# Patient Record
Sex: Female | Born: 1955 | Race: White | Hispanic: No | Marital: Married | Smoking: Never smoker
Health system: Southern US, Community
[De-identification: ages and names within clinical notes are randomized; demographics above are authoritative.]

## PROBLEM LIST (undated history)

## (undated) DIAGNOSIS — M1990 Unspecified osteoarthritis, unspecified site: Secondary | ICD-10-CM

## (undated) DIAGNOSIS — J40 Bronchitis, not specified as acute or chronic: Secondary | ICD-10-CM

## (undated) HISTORY — PX: PARTIAL HIP ARTHROPLASTY: SHX733

## (undated) HISTORY — PX: APPENDECTOMY: SHX54

## (undated) HISTORY — PX: TUBAL LIGATION: SHX77

## (undated) HISTORY — PX: MENISCUS REPAIR: SHX5179

---

## 2019-12-28 ENCOUNTER — Other Ambulatory Visit: Payer: Self-pay | Admitting: Orthopedic Surgery

## 2020-01-17 NOTE — Patient Instructions (Signed)
DUE TO COVID-19 ONLY TWO VISITORS ARE ALLOWED TO COME WITH YOU AND STAY IN THE WAITING ROOM ONLY DURING PRE OP AND PROCEDURE DAY OF SURGERY. THE 2 VISITORS MAY VISIT WITH YOU AFTER SURGERY IN YOUR PRIVATE ROOM DURING VISITING HOURS ONLY!  YOU NEED TO HAVE A COVID 19 TEST ON: 01/24/20 @        , THIS TEST MUST BE DONE BEFORE SURGERY, COME  Danielle, [address] , [id number].  ([place]) ONCE YOUR COVID TEST IS COMPLETED, PLEASE BEGIN THE QUARANTINE INSTRUCTIONS AS OUTLINED IN YOUR HANDOUT.                Danielle Andersen    Your procedure is scheduled on: 01/28/20   Report to [facility] Main  Entrance   Report to SHORT STAY at: 5:30 AM     Call this number if you have problems the morning of surgery [phone]    Remember:   [place], NO CHEWING GUM [place].     Take these medicines the morning of surgery with A SIP OF WATER: CETIRIZINE,GABAPENTIN.USE INHALERS AS USUAL.                                 You may not have any metal on your body including hair pins and              piercings  Do not wear jewelry, make-up, lotions, powders or perfumes, deodorant             Do not wear nail polish on your fingernails.  Do not shave  48 hours prior to surgery.                Do not bring valuables to the hospital. [place].  Contacts, dentures or bridgework may not be worn into surgery.  Leave suitcase in the car. After surgery it may be brought to your room.     Patients discharged the day of surgery will not be allowed to drive home. IF YOU ARE HAVING SURGERY AND GOING HOME THE SAME DAY, YOU MUST HAVE AN ADULT TO DRIVE YOU HOME AND BE WITH YOU FOR 24 HOURS. YOU MAY GO HOME BY TAXI OR UBER OR ORTHERWISE, BUT AN ADULT MUST ACCOMPANY YOU HOME AND STAY WITH YOU FOR 24 HOURS.  Name and phone number of your driver:  Special Instructions: N/A      Please read over the following fact sheets you were given: _____________________________________________________________________             NO SOLID FOOD AFTER MIDNIGHT THE NIGHT PRIOR TO SURGERY. NOTHING BY MOUTH EXCEPT CLEAR LIQUIDS UNTIL: 4:15 AM . PLEASE FINISH ENSURE DRINK PER SURGEON ORDER  WHICH NEEDS TO BE COMPLETED AT: 4:15 AM .   CLEAR LIQUID DIET   Foods Allowed                                                                     Foods Excluded  Coffee  and tea, regular and decaf                             liquids that you cannot  Plain Jell-O any favor except red or purple                                           see through such as: Fruit ices (not with fruit pulp)                                     milk, soups, orange juice  Iced Popsicles                                    All solid food Carbonated beverages, regular and diet                                    Cranberry, grape and apple juices Sports drinks like Gatorade Lightly seasoned clear broth or consume(fat free) Sugar, honey syrup  Sample Menu Breakfast                                Lunch                                     Supper Cranberry juice                    Beef broth                            Chicken broth Jell-O                                     Grape juice                           Apple juice Coffee or tea                        Jell-O                                      Popsicle                                                Coffee or tea                        Coffee or tea  _____________________________________________________________________  [facility] - Preparing for Surgery Before surgery, you can play an important role.  Because skin is not sterile, your skin needs to be as  free of germs as possible.  You can reduce the number of germs on your skin by washing with CHG (chlorahexidine gluconate) soap before surgery.  CHG is an antiseptic cleaner which kills germs and bonds with  the skin to continue killing germs even after washing. Please DO NOT use if you have an allergy to CHG or antibacterial soaps.  If your skin becomes reddened/irritated stop using the CHG and inform your nurse when you arrive at Short Stay. Do not shave (including legs and underarms) for at least 48 hours prior to the first CHG shower.  You may shave your face/neck. Please follow these instructions carefully:  1.  Shower with CHG Soap the night before surgery and the  morning of Surgery.  2.  If you choose to wash your hair, wash your hair first as usual with your  normal  shampoo.  3.  After you shampoo, rinse your hair and body thoroughly to remove the  shampoo.                           4.  Use CHG as you would any other liquid soap.  You can apply chg directly  to the skin and wash                       Gently with a scrungie or clean washcloth.  5.  Apply the CHG Soap to your body ONLY FROM THE NECK DOWN.   Do not use on face/ open                           Wound or open sores. Avoid contact with eyes, ears mouth and genitals (private parts).                       Wash face,  Genitals (private parts) with your normal soap.             6.  Wash thoroughly, paying special attention to the area where your surgery  will be performed.  7.  Thoroughly rinse your body with warm water from the neck down.  8.  DO NOT shower/wash with your normal soap after using and rinsing off  the CHG Soap.                9.  Pat yourself dry with a clean towel.            10.  Wear clean pajamas.            11.  Place clean sheets on your bed the night of your first shower and do not  sleep with pets. Day of Surgery : Do not apply any lotions/deodorants the morning of surgery.  Please wear clean clothes to the hospital/surgery center.  FAILURE TO FOLLOW THESE INSTRUCTIONS MAY RESULT IN THE CANCELLATION OF YOUR SURGERY PATIENT SIGNATURE_________________________________  NURSE  SIGNATURE__________________________________  ________________________________________________________________________   Danielle Andersen  An incentive spirometer is a tool that can help keep your lungs clear and active. This tool measures how well you are filling your lungs with each breath. Taking long deep breaths may help reverse or decrease the chance of developing breathing (pulmonary) problems (especially infection) following:  A long period of time when you are unable to move or be active. BEFORE THE PROCEDURE   If the spirometer includes an indicator to show your best effort,  your nurse or respiratory therapist will set it to a desired goal.  If possible, sit up straight or lean slightly forward. Try not to slouch.  Hold the incentive spirometer in an upright position. INSTRUCTIONS FOR USE  1. Sit on the edge of your bed if possible, or sit up as far as you can in bed or on a chair. 2. Hold the incentive spirometer in an upright position. 3. Breathe out normally. 4. Place the mouthpiece in your mouth and seal your lips tightly around it. 5. Breathe in slowly and as deeply as possible, raising the piston or the ball toward the top of the column. 6. Hold your breath for 3-5 seconds or for as long as possible. Allow the piston or ball to fall to the bottom of the column. 7. Remove the mouthpiece from your mouth and breathe out normally. 8. Rest for a few seconds and repeat Steps 1 through 7 at least 10 times every 1-2 hours when you are awake. Take your time and take a few normal breaths between deep breaths. 9. The spirometer may include an indicator to show your best effort. Use the indicator as a goal to work toward during each repetition. 10. After each set of 10 deep breaths, practice coughing to be sure your lungs are clear. If you have an incision (the cut made at the time of surgery), support your incision when coughing by placing a pillow or rolled up towels firmly  against it. Once you are able to get out of bed, walk around indoors and cough well. You may stop using the incentive spirometer when instructed by your caregiver.  RISKS AND COMPLICATIONS  Take your time so you do not get dizzy or light-headed.  If you are in pain, you may need to take or ask for pain medication before doing incentive spirometry. It is harder to take a deep breath if you are having pain. AFTER USE  Rest and breathe slowly and easily.  It can be helpful to keep track of a log of your progress. Your caregiver can provide you with a simple table to help with this. If you are using the spirometer at home, follow these instructions: Danielle Andersen IF:   You are having difficultly using the spirometer.  You have trouble using the spirometer as often as instructed.  Your pain medication is not giving enough relief while using the spirometer.  You develop fever of 100.5 F (38.1 C) or higher. SEEK IMMEDIATE MEDICAL CARE IF:   You cough up bloody sputum that had not been present before.  You develop fever of 102 F (38.9 C) or greater.  You develop worsening pain at or near the incision site. MAKE SURE YOU:   Understand these instructions.  Will watch your condition.  Will get help right away if you are not doing well or get worse. Document Released: 02/21/2007 Document Revised: 01/03/2012 Document Reviewed: 04/24/2007 [facility] Patient Information 2014 [place], [place].   ________________________________________________________________________

## 2020-01-18 ENCOUNTER — Ambulatory Visit (HOSPITAL_COMMUNITY)
Admission: RE | Admit: 2020-01-18 | Discharge: 2020-01-18 | Disposition: A | Discharge status: Home / Self Care | Payer: No Typology Code available for payment source | Source: Ambulatory Visit | Attending: Orthopedic Surgery | Admitting: Orthopedic Surgery

## 2020-01-18 ENCOUNTER — Encounter (HOSPITAL_COMMUNITY)
Admission: RE | Admit: 2020-01-18 | Discharge: 2020-01-18 | Disposition: A | Discharge status: Home / Self Care | Payer: No Typology Code available for payment source | Source: Ambulatory Visit | Attending: Orthopedic Surgery | Admitting: Orthopedic Surgery

## 2020-01-18 ENCOUNTER — Other Ambulatory Visit: Payer: Self-pay

## 2020-01-18 ENCOUNTER — Encounter (HOSPITAL_COMMUNITY): Payer: Self-pay

## 2020-01-18 DIAGNOSIS — Z01818 Encounter for other preprocedural examination: Secondary | ICD-10-CM | POA: Diagnosis not present

## 2020-01-18 DIAGNOSIS — M1711 Unilateral primary osteoarthritis, right knee: Secondary | ICD-10-CM | POA: Diagnosis present

## 2020-01-18 HISTORY — DX: Unspecified osteoarthritis, unspecified site: M19.90

## 2020-01-18 HISTORY — DX: Bronchitis, not specified as acute or chronic: J40

## 2020-01-18 LAB — CBC WITH DIFFERENTIAL/PLATELET
Abs Immature Granulocytes: 0.02 10*3/uL (ref 0.00–0.07)
Basophils Absolute: 0.1 10*3/uL (ref 0.0–0.1)
Basophils Relative: 1 %
Eosinophils Absolute: 0.1 10*3/uL (ref 0.0–0.5)
Eosinophils Relative: 2 %
HCT: 37.9 % (ref 36.0–46.0)
Hemoglobin: 11.9 g/dL — ABNORMAL LOW (ref 12.0–15.0)
Immature Granulocytes: 0 %
Lymphocytes Relative: 31 %
Lymphs Abs: 2.1 10*3/uL (ref 0.7–4.0)
MCH: 31.1 pg (ref 26.0–34.0)
MCHC: 31.4 g/dL (ref 30.0–36.0)
MCV: 99 fL (ref 80.0–100.0)
Monocytes Absolute: 0.5 10*3/uL (ref 0.1–1.0)
Monocytes Relative: 7 %
Neutro Abs: 4 10*3/uL (ref 1.7–7.7)
Neutrophils Relative %: 59 %
Platelets: 326 10*3/uL (ref 150–400)
RBC: 3.83 MIL/uL — ABNORMAL LOW (ref 3.87–5.11)
RDW: 13.2 % (ref 11.5–15.5)
WBC: 6.7 10*3/uL (ref 4.0–10.5)
nRBC: 0 % (ref 0.0–0.2)

## 2020-01-18 LAB — URINALYSIS, ROUTINE W REFLEX MICROSCOPIC
Bilirubin Urine: NEGATIVE
Glucose, UA: NEGATIVE mg/dL
Hgb urine dipstick: NEGATIVE
Ketones, ur: NEGATIVE mg/dL
Leukocytes,Ua: NEGATIVE
Nitrite: NEGATIVE
Protein, ur: NEGATIVE mg/dL
Specific Gravity, Urine: 1.004 — ABNORMAL LOW (ref 1.005–1.030)
pH: 7 (ref 5.0–8.0)

## 2020-01-18 LAB — BASIC METABOLIC PANEL
Anion gap: 9 (ref 5–15)
BUN: 15 mg/dL (ref 8–23)
CO2: 26 mmol/L (ref 22–32)
Calcium: 9.4 mg/dL (ref 8.9–10.3)
Chloride: 105 mmol/L (ref 98–111)
Creatinine, Ser: 0.66 mg/dL (ref 0.44–1.00)
GFR calc Af Amer: >60 mL/min (ref >60)
GFR calc non Af Amer: >60 mL/min (ref >60)
Glucose, Bld: 82 mg/dL (ref 70–99)
Potassium: 4.2 mmol/L (ref 3.5–5.1)
Sodium: 140 mmol/L (ref 135–145)

## 2020-01-18 LAB — ABO/RH: ABO/RH(D): B POS

## 2020-01-18 LAB — PROTIME-INR
INR: 0.9 (ref 0.8–1.2)
Prothrombin Time: 12.3 seconds (ref 11.4–15.2)

## 2020-01-18 LAB — APTT: aPTT: 30 seconds (ref 24–36)

## 2020-01-18 NOTE — H&P (Signed)
TOTAL KNEE ADMISSION H&P  Patient is being admitted for right total knee arthroplasty.  Subjective:  Chief Complaint:right knee pain.  HPI: Danielle Andersen, 64 y.o. female, has a history of pain and functional disability in the right knee due to trauma and arthritis and has failed non-surgical conservative treatments for greater than 12 weeks to includeNSAID's and/or analgesics, corticosteriod injections, supervised PT with diminished ADL's post treatment and activity modification.  Onset of symptoms was abrupt, starting 3 years ago with rapidlly worsening course since that time. The patient noted prior procedures on the knee to include  arthroscopy on the right knee(s).  Patient currently rates pain in the right knee(s) at 10 out of 10 with activity. Patient has night pain, worsening of pain with activity and weight bearing, pain that interferes with activities of daily living, pain with passive range of motion, crepitus and joint swelling.  Patient has evidence of subchondral sclerosis and joint space narrowing by imaging studies.  There is no active infection.  There are no problems to display for this patient.  Past Medical History:  Diagnosis Date  . Arthritis   . Bronchitis     Past Surgical History:  Procedure Laterality Date  . APPENDECTOMY    . MENISCUS REPAIR Right   . PARTIAL HIP ARTHROPLASTY    . TUBAL LIGATION      No current facility-administered medications for this encounter.   Current Outpatient Medications  Medication Sig Dispense Refill Last Dose  . albuterol (VENTOLIN HFA) 108 (90 Base) MCG/ACT inhaler Inhale 1-2 puffs into the lungs every 6 (six) hours as needed for wheezing or shortness of breath.     . cetirizine (ZYRTEC) 10 MG tablet Take 10 mg by mouth daily.     . diphenhydramine-acetaminophen (TYLENOL PM EXTRA STRENGTH) 25-500 MG TABS tablet Take 1 tablet by mouth at bedtime.     Marland Kitchen DIPHENHYDRAMINE-ACETAMINOPHEN PO Take 1 tablet by mouth daily. Legatrin PM  Pain Reliever/Sleep Aid     . docusate sodium (STOOL SOFTENER) 100 MG capsule Take 100-200 mg by mouth every other day as needed (constipation.).      Marland Kitchen gabapentin (NEURONTIN) 100 MG capsule Take 100 mg by mouth 3 (three) times daily.     . meloxicam (MOBIC) 15 MG tablet Take 15 mg by mouth daily after supper.     . tetrahydrozoline 0.05 % ophthalmic solution Place 2 drops into both eyes daily.     . traMADol (ULTRAM) 50 MG tablet Take 100 mg by mouth 3 (three) times daily as needed.      No Known Allergies  Social History   Tobacco Use  . Smoking status: Never Smoker  . Smokeless tobacco: Never Used  Substance Use Topics  . Alcohol use: Never    No family history on file.   Review of Systems  Constitutional: Positive for fatigue.  HENT: Positive for tinnitus.   Respiratory: Negative.   Cardiovascular: Positive for palpitations.       HTN  Gastrointestinal: Positive for constipation and diarrhea.  Endocrine: Negative.   Genitourinary: Negative.   Musculoskeletal: Positive for arthralgias and myalgias.  Allergic/Immunologic: Negative.   Neurological: Positive for dizziness.  Hematological: Negative.   Psychiatric/Behavioral: Negative.     Objective:  Physical Exam  Constitutional: She is oriented to person, place, and time. She appears well-developed and well-nourished.  HENT:  Head: Normocephalic and atraumatic.  Eyes: Pupils are equal, round, and reactive to light.  Cardiovascular: Intact distal pulses.  Respiratory: Effort normal.  Musculoskeletal:  General: Tenderness present.     Cervical back: Normal range of motion and neck supple.     Comments: Tender along the medial joint line of the right knee range of motion today is 5/130 she walks with a right-sided limp there is obvious varus deformity 1+ effusion.    Neurological: She is oriented to person, place, and time.  Skin: Skin is warm and dry.  Psychiatric: She has a normal mood and affect. Her behavior  is normal. Judgment and thought content normal.    Vital signs in last 24 hours: Temp:  [98.2 F (36.8 C)] 98.2 F (36.8 C) (03/26 0943) Pulse Rate:  [82] 82 (03/26 0943) Resp:  [16] 16 (03/26 0943) BP: (138)/(71) 138/71 (03/26 0943) SpO2:  [99 %] 99 % (03/26 0943) Weight:  [62.7 kg] 62.7 kg (03/26 0943)  Labs:   Estimated body mass index is 25.28 kg/m as calculated from the following:   Height as of 01/18/20: 5\' 2"  (1.575 m).   Weight as of 01/18/20: 62.7 kg.   Imaging Review Plain radiographs confirm the bone-on-bone arthritic changes sclerotic medial bone and early erosion of the medial tibial plateau.  Assessment/Plan:  End stage arthritis, right knee   The patient history, physical examination, clinical judgment of the provider and imaging studies are consistent with end stage degenerative joint disease of the right knee(s) and total knee arthroplasty is deemed medically necessary. The treatment options including medical management, injection therapy arthroscopy and arthroplasty were discussed at length. The risks and benefits of total knee arthroplasty were presented and reviewed. The risks due to aseptic loosening, infection, stiffness, patella tracking problems, thromboembolic complications and other imponderables were discussed. The patient acknowledged the explanation, agreed to proceed with the plan and consent was signed. Patient is being admitted for inpatient treatment for surgery, pain control, PT, OT, prophylactic antibiotics, VTE prophylaxis, progressive ambulation and ADL's and discharge planning. The patient is planning to be discharged home with home health services     Patient's anticipated LOS is less than 2 midnights, meeting these requirements: - Younger than 17 - Lives within 1 hour of care - Has a competent adult at home to recover with post-op recover - NO history of  - Chronic pain requiring opiods  - Diabetes  - Coronary Artery Disease  - Heart  failure  - Heart attack  - Stroke  - DVT/VTE  - Cardiac arrhythmia  - Respiratory Failure/COPD  - Renal failure  - Anemia  - Advanced Liver disease

## 2020-01-18 NOTE — Progress Notes (Signed)
PCP - Jomarie Longs Copper. PAC. LOV: 03/30/19. Cardiologist -   Chest x-ray -  EKG -  Stress Test -  ECHO -  Cardiac Cath -   Sleep Study -  CPAP -   Fasting Blood Sugar -  Checks Blood Sugar _____ times a day  Blood Thinner Instructions: Aspirin Instructions: Last Dose:  Anesthesia review:   Patient denies shortness of breath, fever, cough and chest pain at PAT appointment   Patient verbalized understanding of instructions that were given to them at the PAT appointment. Patient was also instructed that they will need to review over the PAT instructions again at home before surgery.

## 2020-01-24 ENCOUNTER — Other Ambulatory Visit (HOSPITAL_COMMUNITY)
Admission: RE | Admit: 2020-01-24 | Discharge: 2020-01-24 | Disposition: A | Discharge status: Home / Self Care | Payer: No Typology Code available for payment source | Source: Ambulatory Visit | Attending: Orthopedic Surgery | Admitting: Orthopedic Surgery

## 2020-01-24 DIAGNOSIS — Z01818 Encounter for other preprocedural examination: Secondary | ICD-10-CM | POA: Diagnosis not present

## 2020-01-24 LAB — SARS CORONAVIRUS 2 (TAT 6-24 HRS): SARS Coronavirus 2: NEGATIVE

## 2020-01-26 NOTE — Anesthesia Preprocedure Evaluation (Addendum)
Anesthesia Evaluation  Patient identified by MRN, date of birth, ID band Patient awake    Reviewed: Allergy & Precautions, NPO status , Patient's Chart, lab work & pertinent test results  History of Anesthesia Complications Negative for: history of anesthetic complications  Airway Mallampati: II  TM Distance: >3 FB Neck ROM: Full    Dental  (+) Missing,    Pulmonary neg pulmonary ROS,    Pulmonary exam normal        Cardiovascular negative cardio ROS Normal cardiovascular exam     Neuro/Psych negative neurological ROS  negative psych ROS   GI/Hepatic negative GI ROS, Neg liver ROS,   Endo/Other  negative endocrine ROS  Renal/GU negative Renal ROS  negative genitourinary   Musculoskeletal  (+) Arthritis ,   Abdominal   Peds  Hematology negative hematology ROS (+)   Anesthesia Other Findings Day of surgery medications reviewed with patient.  Reproductive/Obstetrics negative OB ROS                            Anesthesia Physical Anesthesia Plan  ASA: II  Anesthesia Plan: Spinal   Post-op Pain Management:  Regional for Post-op pain   Induction:   PONV Risk Score and Plan: 3 and Treatment may vary due to age or medical condition, Ondansetron, Propofol infusion and Dexamethasone  Airway Management Planned: Natural Airway and Simple Face Mask  Additional Equipment: None  Intra-op Plan:   Post-operative Plan:   Informed Consent: I have reviewed the patients History and Physical, chart, labs and discussed the procedure including the risks, benefits and alternatives for the proposed anesthesia with the patient or authorized representative who has indicated his/her understanding and acceptance.       Plan Discussed with: CRNA  Anesthesia Plan Comments:        Anesthesia Quick Evaluation

## 2020-01-27 MED ORDER — TRANEXAMIC ACID 1000 MG/10ML IV SOLN
2000.0000 mg | INTRAVENOUS | Status: DC
  Filled 2020-01-27: qty 20

## 2020-01-27 MED ORDER — BUPIVACAINE LIPOSOME 1.3 % IJ SUSP
20.0000 mL | Freq: Once | INTRAMUSCULAR | Status: DC
  Filled 2020-01-27: qty 20

## 2020-01-28 ENCOUNTER — Encounter (HOSPITAL_COMMUNITY): Payer: Self-pay | Admitting: Orthopedic Surgery

## 2020-01-28 ENCOUNTER — Ambulatory Visit (HOSPITAL_COMMUNITY): Payer: No Typology Code available for payment source | Admitting: Anesthesiology

## 2020-01-28 ENCOUNTER — Observation Stay (HOSPITAL_COMMUNITY)
Admission: RE | Admit: 2020-01-28 | Discharge: 2020-01-29 | Disposition: A | Discharge status: Home / Self Care | Payer: No Typology Code available for payment source | Source: Ambulatory Visit | Attending: Orthopedic Surgery | Admitting: Orthopedic Surgery

## 2020-01-28 ENCOUNTER — Encounter (HOSPITAL_COMMUNITY)
Admission: RE | Disposition: A | Discharge status: Home / Self Care | Payer: Self-pay | Source: Ambulatory Visit | Attending: Orthopedic Surgery

## 2020-01-28 DIAGNOSIS — Z791 Long term (current) use of non-steroidal anti-inflammatories (NSAID): Secondary | ICD-10-CM | POA: Diagnosis not present

## 2020-01-28 DIAGNOSIS — Z79899 Other long term (current) drug therapy: Secondary | ICD-10-CM | POA: Insufficient documentation

## 2020-01-28 DIAGNOSIS — M1711 Unilateral primary osteoarthritis, right knee: Secondary | ICD-10-CM | POA: Diagnosis present

## 2020-01-28 DIAGNOSIS — Z96651 Presence of right artificial knee joint: Secondary | ICD-10-CM

## 2020-01-28 HISTORY — PX: TOTAL KNEE ARTHROPLASTY: SHX125

## 2020-01-28 LAB — TYPE AND SCREEN
ABO/RH(D): B POS
Antibody Screen: NEGATIVE

## 2020-01-28 SURGERY — ARTHROPLASTY, KNEE, TOTAL
Anesthesia: Spinal | Site: Knee | Laterality: Right

## 2020-01-28 MED ORDER — DIPHENHYDRAMINE-ACETAMINOPHEN 12.5-500 MG PO TABS: ORAL_TABLET | Freq: Every day | ORAL | Status: DC

## 2020-01-28 MED ORDER — ONDANSETRON HCL 4 MG/2ML IJ SOLN: 4.0000 mg | Freq: Four times a day (QID) | INTRAMUSCULAR | Status: DC | PRN

## 2020-01-28 MED ORDER — LORATADINE 10 MG PO TABS
10.0000 mg | ORAL_TABLET | Freq: Every day | ORAL | Status: DC
  Administered 2020-01-28 – 2020-01-29 (×2): 10 mg via ORAL
  Filled 2020-01-28 (×2): qty 1

## 2020-01-28 MED ORDER — BUPIVACAINE LIPOSOME 1.3 % IJ SUSP
INTRAMUSCULAR | Status: DC | PRN
  Administered 2020-01-28: 20 mL

## 2020-01-28 MED ORDER — ZOLPIDEM TARTRATE 5 MG PO TABS: 5.0000 mg | ORAL_TABLET | Freq: Every evening | ORAL | Status: DC | PRN

## 2020-01-28 MED ORDER — BUPIVACAINE IN DEXTROSE 0.75-8.25 % IT SOLN
INTRATHECAL | Status: DC | PRN
  Administered 2020-01-28: 1.6 mL via INTRATHECAL

## 2020-01-28 MED ORDER — LIDOCAINE 2% (20 MG/ML) 5 ML SYRINGE
INTRAMUSCULAR | Status: DC | PRN
  Administered 2020-01-28: 40 mg via INTRAVENOUS

## 2020-01-28 MED ORDER — METHOCARBAMOL 500 MG IVPB - SIMPLE MED
500.0000 mg | Freq: Four times a day (QID) | INTRAVENOUS | Status: DC | PRN
  Filled 2020-01-28 (×3): qty 50

## 2020-01-28 MED ORDER — ALUM & MAG HYDROXIDE-SIMETH 200-200-20 MG/5ML PO SUSP: 30.0000 mL | ORAL | Status: DC | PRN

## 2020-01-28 MED ORDER — ALBUTEROL SULFATE (2.5 MG/3ML) 0.083% IN NEBU: 3.0000 mL | INHALATION_SOLUTION | Freq: Four times a day (QID) | RESPIRATORY_TRACT | Status: DC | PRN

## 2020-01-28 MED ORDER — FLEET ENEMA 7-19 GM/118ML RE ENEM: 1.0000 | ENEMA | Freq: Once | RECTAL | Status: DC | PRN

## 2020-01-28 MED ORDER — PHENYLEPHRINE HCL (PRESSORS) 10 MG/ML IV SOLN
INTRAVENOUS | Status: AC
  Filled 2020-01-28: qty 2

## 2020-01-28 MED ORDER — POVIDONE-IODINE 10 % EX SWAB
2.0000 "application " | Freq: Once | CUTANEOUS | Status: AC
  Administered 2020-01-28: 2 via TOPICAL

## 2020-01-28 MED ORDER — OXYCODONE HCL 5 MG PO TABS: 5.0000 mg | ORAL_TABLET | Freq: Once | ORAL | Status: DC | PRN

## 2020-01-28 MED ORDER — PROPOFOL 1000 MG/100ML IV EMUL
INTRAVENOUS | Status: AC
  Filled 2020-01-28: qty 100

## 2020-01-28 MED ORDER — TRANEXAMIC ACID-NACL 1000-0.7 MG/100ML-% IV SOLN
1000.0000 mg | Freq: Once | INTRAVENOUS | Status: AC
  Administered 2020-01-28: 1000 mg via INTRAVENOUS
  Filled 2020-01-28: qty 100

## 2020-01-28 MED ORDER — SODIUM CHLORIDE (PF) 0.9 % IJ SOLN
INTRAMUSCULAR | Status: DC | PRN
  Administered 2020-01-28: 70 mL

## 2020-01-28 MED ORDER — HYDROMORPHONE HCL 1 MG/ML IJ SOLN
0.5000 mg | INTRAMUSCULAR | Status: DC | PRN
  Administered 2020-01-28: 15:00:00 1 mg via INTRAVENOUS
  Filled 2020-01-28: qty 1

## 2020-01-28 MED ORDER — ACETAMINOPHEN 325 MG PO TABS: 325.0000 mg | ORAL_TABLET | Freq: Four times a day (QID) | ORAL | Status: DC | PRN

## 2020-01-28 MED ORDER — SODIUM CHLORIDE 0.9 % IR SOLN
Status: DC | PRN
  Administered 2020-01-28: 1000 mL

## 2020-01-28 MED ORDER — EPHEDRINE 5 MG/ML INJ
INTRAVENOUS | Status: AC
  Filled 2020-01-28: qty 10

## 2020-01-28 MED ORDER — FENTANYL CITRATE (PF) 100 MCG/2ML IJ SOLN
INTRAMUSCULAR | Status: DC | PRN
  Administered 2020-01-28: 50 ug via INTRAVENOUS

## 2020-01-28 MED ORDER — ACETAMINOPHEN 500 MG PO TABS
1000.0000 mg | ORAL_TABLET | Freq: Once | ORAL | Status: AC
  Administered 2020-01-28: 1000 mg via ORAL
  Filled 2020-01-28: qty 2

## 2020-01-28 MED ORDER — PHENOL 1.4 % MT LIQD: 1.0000 | OROMUCOSAL | Status: DC | PRN

## 2020-01-28 MED ORDER — MIDAZOLAM HCL 5 MG/5ML IJ SOLN
INTRAMUSCULAR | Status: DC | PRN
  Administered 2020-01-28: 2 mg via INTRAVENOUS

## 2020-01-28 MED ORDER — MENTHOL 3 MG MT LOZG: 1.0000 | LOZENGE | OROMUCOSAL | Status: DC | PRN

## 2020-01-28 MED ORDER — ACETAMINOPHEN 500 MG PO TABS
500.0000 mg | ORAL_TABLET | Freq: Every day | ORAL | Status: DC
  Administered 2020-01-29: 10:00:00 500 mg via ORAL
  Filled 2020-01-28: qty 1

## 2020-01-28 MED ORDER — METHOCARBAMOL 500 MG PO TABS
500.0000 mg | ORAL_TABLET | Freq: Four times a day (QID) | ORAL | Status: DC | PRN
  Filled 2020-01-28: qty 1

## 2020-01-28 MED ORDER — GABAPENTIN 100 MG PO CAPS
100.0000 mg | ORAL_CAPSULE | Freq: Three times a day (TID) | ORAL | Status: DC
  Administered 2020-01-28 – 2020-01-29 (×4): 100 mg via ORAL
  Filled 2020-01-28 (×4): qty 1

## 2020-01-28 MED ORDER — CELECOXIB 200 MG PO CAPS
200.0000 mg | ORAL_CAPSULE | Freq: Two times a day (BID) | ORAL | Status: DC
  Administered 2020-01-28 – 2020-01-29 (×2): 200 mg via ORAL
  Filled 2020-01-28 (×2): qty 1

## 2020-01-28 MED ORDER — LACTATED RINGERS IV SOLN: INTRAVENOUS | Status: DC

## 2020-01-28 MED ORDER — POLYETHYLENE GLYCOL 3350 17 G PO PACK: 17.0000 g | PACK | Freq: Every day | ORAL | Status: DC | PRN

## 2020-01-28 MED ORDER — DEXAMETHASONE SODIUM PHOSPHATE 10 MG/ML IJ SOLN
INTRAMUSCULAR | Status: AC
  Filled 2020-01-28: qty 1

## 2020-01-28 MED ORDER — DEXAMETHASONE SODIUM PHOSPHATE 10 MG/ML IJ SOLN
INTRAMUSCULAR | Status: DC | PRN
  Administered 2020-01-28: 8 mg via INTRAVENOUS

## 2020-01-28 MED ORDER — WATER FOR IRRIGATION, STERILE IR SOLN
Status: DC | PRN
  Administered 2020-01-28: 2000 mL

## 2020-01-28 MED ORDER — DIPHENHYDRAMINE-APAP (SLEEP) 25-500 MG PO TABS: 1.0000 | ORAL_TABLET | Freq: Every day | ORAL | Status: DC

## 2020-01-28 MED ORDER — PROMETHAZINE HCL 25 MG/ML IJ SOLN: 6.2500 mg | INTRAMUSCULAR | Status: DC | PRN

## 2020-01-28 MED ORDER — PROPOFOL 10 MG/ML IV BOLUS
INTRAVENOUS | Status: DC | PRN
  Administered 2020-01-28: 20 mg via INTRAVENOUS
  Administered 2020-01-28: 40 ug/kg/min via INTRAVENOUS

## 2020-01-28 MED ORDER — PHENYLEPHRINE 40 MCG/ML (10ML) SYRINGE FOR IV PUSH (FOR BLOOD PRESSURE SUPPORT)
PREFILLED_SYRINGE | INTRAVENOUS | Status: AC
  Filled 2020-01-28: qty 10

## 2020-01-28 MED ORDER — DIPHENHYDRAMINE HCL 12.5 MG/5ML PO ELIX
12.5000 mg | ORAL_SOLUTION | Freq: Every day | ORAL | Status: DC
  Filled 2020-01-28: qty 5

## 2020-01-28 MED ORDER — PANTOPRAZOLE SODIUM 40 MG PO TBEC
40.0000 mg | DELAYED_RELEASE_TABLET | Freq: Every day | ORAL | Status: DC
  Administered 2020-01-29: 08:00:00 40 mg via ORAL
  Filled 2020-01-28: qty 1

## 2020-01-28 MED ORDER — BISACODYL 5 MG PO TBEC: 5.0000 mg | DELAYED_RELEASE_TABLET | Freq: Every day | ORAL | Status: DC | PRN

## 2020-01-28 MED ORDER — MIDAZOLAM HCL 2 MG/2ML IJ SOLN
INTRAMUSCULAR | Status: AC
  Filled 2020-01-28: qty 2

## 2020-01-28 MED ORDER — SODIUM CHLORIDE (PF) 0.9 % IJ SOLN
INTRAMUSCULAR | Status: AC
  Filled 2020-01-28: qty 100

## 2020-01-28 MED ORDER — TIZANIDINE HCL 2 MG PO TABS: 2.0000 mg | ORAL_TABLET | Freq: Four times a day (QID) | ORAL | 0 refills | Status: AC | PRN

## 2020-01-28 MED ORDER — FENTANYL CITRATE (PF) 100 MCG/2ML IJ SOLN
INTRAMUSCULAR | Status: AC
  Filled 2020-01-28: qty 2

## 2020-01-28 MED ORDER — ONDANSETRON HCL 4 MG PO TABS: 4.0000 mg | ORAL_TABLET | Freq: Four times a day (QID) | ORAL | Status: DC | PRN

## 2020-01-28 MED ORDER — BUPIVACAINE HCL (PF) 0.25 % IJ SOLN
INTRAMUSCULAR | Status: DC | PRN
  Administered 2020-01-28: 30 mL

## 2020-01-28 MED ORDER — PHENYLEPHRINE 40 MCG/ML (10ML) SYRINGE FOR IV PUSH (FOR BLOOD PRESSURE SUPPORT)
PREFILLED_SYRINGE | INTRAVENOUS | Status: DC | PRN
  Administered 2020-01-28 (×3): 80 ug via INTRAVENOUS

## 2020-01-28 MED ORDER — BUPIVACAINE HCL (PF) 0.25 % IJ SOLN
INTRAMUSCULAR | Status: AC
  Filled 2020-01-28: qty 30

## 2020-01-28 MED ORDER — DIPHENHYDRAMINE HCL 25 MG PO CAPS
25.0000 mg | ORAL_CAPSULE | Freq: Every day | ORAL | Status: DC
  Administered 2020-01-28: 25 mg via ORAL
  Filled 2020-01-28: qty 1

## 2020-01-28 MED ORDER — OXYCODONE HCL 5 MG PO TABS
5.0000 mg | ORAL_TABLET | ORAL | Status: DC | PRN
  Administered 2020-01-28 – 2020-01-29 (×4): 10 mg via ORAL
  Filled 2020-01-28 (×4): qty 2

## 2020-01-28 MED ORDER — DIPHENHYDRAMINE HCL 12.5 MG/5ML PO ELIX: 12.5000 mg | ORAL_SOLUTION | ORAL | Status: DC | PRN

## 2020-01-28 MED ORDER — PHENYLEPHRINE HCL-NACL 20-0.9 MG/250ML-% IV SOLN
INTRAVENOUS | Status: DC | PRN
  Administered 2020-01-28: 25 ug/min via INTRAVENOUS

## 2020-01-28 MED ORDER — TRANEXAMIC ACID-NACL 1000-0.7 MG/100ML-% IV SOLN
1000.0000 mg | INTRAVENOUS | Status: AC
  Administered 2020-01-28: 1000 mg via INTRAVENOUS
  Filled 2020-01-28: qty 100

## 2020-01-28 MED ORDER — ASPIRIN EC 81 MG PO TBEC: 81.0000 mg | DELAYED_RELEASE_TABLET | Freq: Two times a day (BID) | ORAL | 0 refills | Status: AC

## 2020-01-28 MED ORDER — ONDANSETRON HCL 4 MG/2ML IJ SOLN
INTRAMUSCULAR | Status: AC
  Filled 2020-01-28: qty 2

## 2020-01-28 MED ORDER — KCL IN DEXTROSE-NACL 20-5-0.45 MEQ/L-%-% IV SOLN
INTRAVENOUS | Status: DC
  Filled 2020-01-28 (×3): qty 1000

## 2020-01-28 MED ORDER — OXYCODONE HCL 5 MG/5ML PO SOLN: 5.0000 mg | Freq: Once | ORAL | Status: DC | PRN

## 2020-01-28 MED ORDER — BUPIVACAINE-EPINEPHRINE (PF) 0.5% -1:200000 IJ SOLN
INTRAMUSCULAR | Status: DC | PRN
  Administered 2020-01-28: 15 mL via PERINEURAL

## 2020-01-28 MED ORDER — TRANEXAMIC ACID 1000 MG/10ML IV SOLN
INTRAVENOUS | Status: DC | PRN
  Administered 2020-01-28: 2000 mg via TOPICAL

## 2020-01-28 MED ORDER — FENTANYL CITRATE (PF) 100 MCG/2ML IJ SOLN
25.0000 ug | INTRAMUSCULAR | Status: DC | PRN
  Administered 2020-01-28: 10:00:00 50 ug via INTRAVENOUS

## 2020-01-28 MED ORDER — CEFAZOLIN SODIUM-DEXTROSE 2-4 GM/100ML-% IV SOLN
2.0000 g | INTRAVENOUS | Status: AC
  Administered 2020-01-28: 2 g via INTRAVENOUS
  Filled 2020-01-28: qty 100

## 2020-01-28 MED ORDER — EPHEDRINE SULFATE-NACL 50-0.9 MG/10ML-% IV SOSY
PREFILLED_SYRINGE | INTRAVENOUS | Status: DC | PRN
  Administered 2020-01-28 (×2): 5 mg via INTRAVENOUS

## 2020-01-28 MED ORDER — DOCUSATE SODIUM 100 MG PO CAPS
100.0000 mg | ORAL_CAPSULE | Freq: Two times a day (BID) | ORAL | Status: DC
  Administered 2020-01-28 – 2020-01-29 (×2): 100 mg via ORAL
  Filled 2020-01-28 (×2): qty 1

## 2020-01-28 MED ORDER — OXYCODONE-ACETAMINOPHEN 5-325 MG PO TABS: 1.0000 | ORAL_TABLET | ORAL | 0 refills | Status: AC | PRN

## 2020-01-28 MED ORDER — METOCLOPRAMIDE HCL 5 MG/ML IJ SOLN: 5.0000 mg | Freq: Three times a day (TID) | INTRAMUSCULAR | Status: DC | PRN

## 2020-01-28 MED ORDER — ASPIRIN 81 MG PO CHEW
81.0000 mg | CHEWABLE_TABLET | Freq: Two times a day (BID) | ORAL | Status: DC
  Administered 2020-01-28 – 2020-01-29 (×2): 81 mg via ORAL
  Filled 2020-01-28 (×2): qty 1

## 2020-01-28 MED ORDER — ACETAMINOPHEN 500 MG PO TABS
500.0000 mg | ORAL_TABLET | Freq: Every day | ORAL | Status: DC
  Administered 2020-01-28: 22:00:00 500 mg via ORAL
  Filled 2020-01-28: qty 1

## 2020-01-28 MED ORDER — ONDANSETRON HCL 4 MG/2ML IJ SOLN
INTRAMUSCULAR | Status: DC | PRN
  Administered 2020-01-28: 4 mg via INTRAVENOUS

## 2020-01-28 MED ORDER — CHLORHEXIDINE GLUCONATE 4 % EX LIQD: 60.0000 mL | Freq: Once | CUTANEOUS | Status: DC

## 2020-01-28 MED ORDER — NAPHAZOLINE-GLYCERIN 0.012-0.2 % OP SOLN
1.0000 [drp] | Freq: Two times a day (BID) | OPHTHALMIC | Status: DC | PRN
  Filled 2020-01-28: qty 15

## 2020-01-28 MED ORDER — METOCLOPRAMIDE HCL 5 MG PO TABS: 5.0000 mg | ORAL_TABLET | Freq: Three times a day (TID) | ORAL | Status: DC | PRN

## 2020-01-28 SURGICAL SUPPLY — 50 items
ATTUNE PS FEM RT SZ 4 CEM KNEE (Femur) ×3 IMPLANT
ATTUNE PSRP INSR SZ4 6 KNEE (Insert) ×2 IMPLANT
ATTUNE PSRP INSR SZ4 6MM KNEE (Insert) ×1 IMPLANT
BAG DECANTER FOR FLEXI CONT (MISCELLANEOUS) ×3 IMPLANT
BAG ZIPLOCK 12X15 (MISCELLANEOUS) ×3 IMPLANT
BASE TIBIAL ROT PLAT SZ 5 KNEE (Knees) ×1 IMPLANT
BLADE SAG 18X100X1.27 (BLADE) ×3 IMPLANT
BLADE SAW SGTL 11.0X1.19X90.0M (BLADE) ×3 IMPLANT
BLADE SURG SZ10 CARB STEEL (BLADE) ×6 IMPLANT
BNDG ELASTIC 6X10 VLCR STRL LF (GAUZE/BANDAGES/DRESSINGS) ×3 IMPLANT
BOWL SMART MIX CTS (DISPOSABLE) ×3 IMPLANT
CEMENT HV SMART SET (Cement) ×6 IMPLANT
COVER SURGICAL LIGHT HANDLE (MISCELLANEOUS) ×3 IMPLANT
COVER WAND RF STERILE (DRAPES) IMPLANT
CUFF TOURN SGL QUICK 34 (TOURNIQUET CUFF) ×2
CUFF TRNQT CYL 34X4.125X (TOURNIQUET CUFF) ×1 IMPLANT
DECANTER SPIKE VIAL GLASS SM (MISCELLANEOUS) ×9 IMPLANT
DRAPE U-SHAPE 47X51 STRL (DRAPES) ×3 IMPLANT
DRSG AQUACEL AG ADV 3.5X10 (GAUZE/BANDAGES/DRESSINGS) ×3 IMPLANT
DURAPREP 26ML APPLICATOR (WOUND CARE) ×3 IMPLANT
ELECT REM PT RETURN 15FT ADLT (MISCELLANEOUS) ×3 IMPLANT
GLOVE BIO SURGEON STRL SZ7.5 (GLOVE) ×3 IMPLANT
GLOVE BIO SURGEON STRL SZ8.5 (GLOVE) ×3 IMPLANT
GLOVE BIOGEL PI IND STRL 8 (GLOVE) ×1 IMPLANT
GLOVE BIOGEL PI IND STRL 9 (GLOVE) ×1 IMPLANT
GLOVE BIOGEL PI INDICATOR 8 (GLOVE) ×2
GLOVE BIOGEL PI INDICATOR 9 (GLOVE) ×2
GOWN STRL REUS W/TWL XL LVL3 (GOWN DISPOSABLE) ×6 IMPLANT
HANDPIECE INTERPULSE COAX TIP (DISPOSABLE) ×2
HOOD PEEL AWAY FLYTE STAYCOOL (MISCELLANEOUS) ×9 IMPLANT
KIT TURNOVER KIT A (KITS) IMPLANT
NEEDLE HYPO 21X1.5 SAFETY (NEEDLE) ×6 IMPLANT
NS IRRIG 1000ML POUR BTL (IV SOLUTION) ×3 IMPLANT
PACK ICE MAXI GEL EZY WRAP (MISCELLANEOUS) ×3 IMPLANT
PACK TOTAL KNEE CUSTOM (KITS) ×3 IMPLANT
PATELLA MEDIAL ATTUN 35MM KNEE (Knees) ×3 IMPLANT
PENCIL SMOKE EVACUATOR (MISCELLANEOUS) IMPLANT
PIN STEINMAN FIXATION KNEE (PIN) ×3 IMPLANT
PROTECTOR NERVE ULNAR (MISCELLANEOUS) ×3 IMPLANT
SET HNDPC FAN SPRY TIP SCT (DISPOSABLE) ×1 IMPLANT
SUT VIC AB 1 CTX 36 (SUTURE) ×2
SUT VIC AB 1 CTX36XBRD ANBCTR (SUTURE) ×1 IMPLANT
SUT VIC AB 3-0 CT1 27 (SUTURE) ×6
SUT VIC AB 3-0 CT1 TAPERPNT 27 (SUTURE) ×3 IMPLANT
SYR CONTROL 10ML LL (SYRINGE) ×6 IMPLANT
TIBIAL BASE ROT PLAT SZ 5 KNEE (Knees) ×3 IMPLANT
TRAY FOLEY MTR SLVR 16FR STAT (SET/KITS/TRAYS/PACK) ×3 IMPLANT
WATER STERILE IRR 1000ML POUR (IV SOLUTION) ×6 IMPLANT
WRAP KNEE MAXI GEL POST OP (GAUZE/BANDAGES/DRESSINGS) ×3 IMPLANT
YANKAUER SUCT BULB TIP 10FT TU (MISCELLANEOUS) ×3 IMPLANT

## 2020-01-28 NOTE — Anesthesia Postprocedure Evaluation (Signed)
Anesthesia Post Note  Patient: Danielle Andersen  Procedure(s) Performed: RIGHT TOTAL KNEE ARTHROPLASTY (Right Knee)     Patient location during evaluation: PACU Anesthesia Type: Spinal Level of consciousness: awake and alert and oriented Pain management: pain level controlled Vital Signs Assessment: post-procedure vital signs reviewed and stable Respiratory status: spontaneous breathing, nonlabored ventilation and respiratory function stable Cardiovascular status: blood pressure returned to baseline Postop Assessment: no apparent nausea or vomiting, spinal receding, no headache and no backache Anesthetic complications: no    Last Vitals:  Vitals:   01/28/20 0611 01/28/20 0915  BP: (!) 151/84 112/63  Pulse: 72 88  Resp: 18 16  Temp: 36.7 C 36.4 C  SpO2: 99% 98%    Last Pain:  Vitals:   01/28/20 1015  TempSrc:   PainSc: 5                  Kaylyn Layer

## 2020-01-28 NOTE — TOC Initial Note (Addendum)
Transition of Care [facility]) - Initial/Assessment Note    Patient Details  Name: Danielle Andersen MRN: [id number] Date of Birth: [birth year]  Transition of [facility]) CM/SW Contact:    Lia Hopping, [place] Phone Number: 01/28/2020, 3:46 PM  Clinical Narrative:                 Per chart review, it appears the patient will need Home Health services and DME -RW at discharge. CSW met with the patient at bedside to discuss discharge plan and workers comp information . Patient provided CSW with her Workers Buyer, retail [phone]. CSW reached out to the case manager, CM reports the patient [facility] and DME needs will be arranged through [place] to contact [phone].  CSW faxed requested clinical documents-H&P, therapy notes, OP note, and DME-RW and H.Health orders faxed [phone].  TOC will continue to assist with discharge needs.    Expected Discharge Plan: [place] Barriers to Discharge: Continued Medical Work up   Patient Goals and CMS Choice   CMS Medicare.gov Compare Post Acute Care list provided to:: Patient    Expected Discharge Plan and Services Expected Discharge Plan: [place]   Discharge Planning Services: CM Consult Post Acute Care Choice: Durable Medical Equipment, Home Health Living arrangements for the past 2 months: Single Family Home                                      Prior Living Arrangements/Services Living arrangements for the past 2 months: Single Family Home Lives with:: Spouse Patient language and need for interpreter reviewed:: No Do you feel safe going back to the place where you live?: Yes      Need for Family Participation in Patient Care: Yes (Comment) Care giver support system in place?: Yes (comment)   Criminal Activity/Legal Involvement Pertinent to Current Situation/Hospitalization: No - Comment as needed  Activities of Daily Living Home Assistive  Devices/Equipment: Eyeglasses ADL Screening (condition at time of admission) Patient's cognitive ability adequate to safely complete daily activities?: Yes Is the patient deaf or have difficulty hearing?: No Does the patient have difficulty seeing, even when wearing glasses/contacts?: No Does the patient have difficulty concentrating, remembering, or making decisions?: No Patient able to express need for assistance with ADLs?: Yes Does the patient have difficulty dressing or bathing?: No Independently performs ADLs?: Yes (appropriate for developmental age) Does the patient have difficulty walking or climbing stairs?: Yes Weakness of Legs: Right Weakness of Arms/Hands: None  Permission Sought/Granted Permission sought to share information with : Case Manager, Family Supports Permission granted to share information with : Yes, Verbal Permission Granted  Share Information with NAME: Gongora,JOSEPH  Permission granted to share info w AGENCY: Workers Complenation Case Manager- [employer] [phone]  Permission granted to share info w Relationship: Spouse  Permission granted to share info w Contact Information: [phone]  Emotional Assessment Appearance:: Appears stated age   Affect (typically observed): Accepting Orientation: : Oriented to Self, Oriented to Place, Oriented to  Time, Oriented to Situation Alcohol / Substance Use: Not Applicable Psych Involvement: No (comment)  Admission diagnosis:  Total knee replacement status, right [Z96.651] Patient Active Problem List   Diagnosis Date Noted  . Total knee replacement status, right 01/28/2020  . Osteoarthritis of right knee 01/18/2020   PCP:  Copper, Virginia [place], PA-C Pharmacy:   [place] -  [place] [address] Phone: [phone] Fax: [phone]     Social Determinants of Health (SDOH) Interventions    Readmission Risk  Interventions No flowsheet data found.

## 2020-01-28 NOTE — Transfer of Care (Signed)
Immediate Anesthesia Transfer of Care Note  Patient: Danielle Andersen  Procedure(s) Performed: RIGHT TOTAL KNEE ARTHROPLASTY (Right Knee)  Patient Location: PACU  Anesthesia Type:MAC and Spinal  Level of Consciousness: awake, alert , oriented and patient cooperative  Airway & Oxygen Therapy: Patient Spontanous Breathing and Patient connected to face mask oxygen  Post-op Assessment: Report given to RN and Post -op Vital signs reviewed and stable  Post vital signs: Reviewed and stable  Last Vitals:  Vitals Value Taken Time  BP 112/63 01/28/20 0915  Temp    Pulse 94 01/28/20 0917  Resp 17 01/28/20 0917  SpO2 98 % 01/28/20 0917  Vitals shown include unvalidated device data.  Last Pain:  Vitals:   01/28/20 0611  TempSrc: Oral  PainSc: 3          Complications: No apparent anesthesia complications

## 2020-01-28 NOTE — Discharge Instructions (Signed)

## 2020-01-28 NOTE — Progress Notes (Signed)
Orthopedic Tech Progress Note Patient Details:  Ashtan Laton [birth year] [phone] Brought bone foam to room. Nurse said she will apply once patient's pain meds kick in.  Ortho Devices Type of Ortho Device: Bone foam zero knee Ortho Device/Splint Interventions: Ordered, Other (comment)(brought to room)   Post Interventions Patient Tolerated: Well Instructions Provided: Care of device  Patient ID: Sidni Fusco, female   DOB: 09-Nov-1955, 64 y.o.   MRN: [id number]   Ancil Linsey 01/28/2020, 11:36 AM

## 2020-01-28 NOTE — Anesthesia Procedure Notes (Addendum)
Anesthesia Regional Block: Adductor canal block   Pre-Anesthetic Checklist: ,, timeout performed, Correct Patient, Correct Site, Correct Laterality, Correct Procedure, Correct Position, site marked, Risks and benefits discussed, pre-op evaluation,  At surgeon's request and post-op pain management  Laterality: Right  Prep: Maximum Sterile Barrier Precautions used, chloraprep       Needles:  Injection technique: Single-shot  Needle Type: Echogenic Stimulator Needle     Needle Length: 9cm  Needle Gauge: 22     Additional Needles:   Procedures:,,,, ultrasound used (permanent image in chart),,,,  Narrative:  Start time: 01/28/2020 7:07 AM End time: 01/28/2020 7:09 AM Injection made incrementally with aspirations every 5 mL.  Performed by: Personally  Anesthesiologist: Kaylyn Layer, MD  Additional Notes: Risks, benefits, and alternative discussed. Patient gave consent for procedure. Patient prepped and draped in sterile fashion. Sedation administered, patient remains easily responsive to voice. Relevant anatomy identified with ultrasound guidance. Local anesthetic given in 5cc increments with no signs or symptoms of intravascular injection. No pain or paraesthesias with injection. Patient monitored throughout procedure with signs of LAST or immediate complications. Tolerated well. Ultrasound image placed in chart.  Amalia Greenhouse, MD

## 2020-01-28 NOTE — Interval H&P Note (Signed)
History and Physical Interval Note:  01/28/2020 6:58 AM  Danielle Andersen  has presented today for surgery, with the diagnosis of RIGHT KNEE OSTEOARTHRITIS.  The various methods of treatment have been discussed with the patient and family. After consideration of risks, benefits and other options for treatment, the patient has consented to  Procedure(s): RIGHT TOTAL KNEE ARTHROPLASTY (Right) as a surgical intervention.  The patient's history has been reviewed, patient examined, no change in status, stable for surgery.  I have reviewed the patient's chart and labs.  Questions were answered to the patient's satisfaction.     Nestor Lewandowsky

## 2020-01-28 NOTE — Anesthesia Procedure Notes (Signed)
Spinal  Patient location during procedure: OR Start time: 01/28/2020 7:18 AM End time: 01/28/2020 7:24 AM Staffing Performed: anesthesiologist  Anesthesiologist: Kaylyn Layer, MD Preanesthetic Checklist Completed: patient identified, IV checked, risks and benefits discussed, surgical consent, monitors and equipment checked, pre-op evaluation and timeout performed Spinal Block Patient position: sitting Prep: DuraPrep and site prepped and draped Patient monitoring: continuous pulse ox, blood pressure and heart rate Approach: right paramedian Location: L3-4 Injection technique: single-shot Needle Needle type: Pencan and Whitacre  Needle gauge: 22 G Needle length: 9 cm Additional Notes Risks, benefits, and alternative discussed. Patient gave consent to procedure. Prepped and draped in sitting position. Patient sedated but responsive to voice. Several attempts by CRNA at L3-4 and L2-3 unsuccessful. Midline at L3-4 by myself unable to access interspinous space. Right paramedian approach with Whitacre 22g with clear CSF obtained. Positive terminal aspiration. No pain or paraesthesias with injection. Patient tolerated procedure well. Vital signs stable. Amalia Greenhouse, MD

## 2020-01-28 NOTE — Addendum Note (Signed)
Addendum  created 01/28/20 1127 by Elisabeth Cara, CRNA   Flowsheet accepted, Intraprocedure Flowsheets edited

## 2020-01-28 NOTE — Op Note (Signed)
PATIENT ID:      Danielle Andersen  MRN:     [id number] DOB/AGE:    Nov 14, 1955 / 64 y.o.       OPERATIVE REPORT   DATE OF PROCEDURE:  01/28/2020      PREOPERATIVE DIAGNOSIS:   RIGHT KNEE OSTEOARTHRITIS      Estimated body mass index is 25.28 kg/m as calculated from the following:   Height as of this encounter: 5\' 2"  (1.575 m).   Weight as of this encounter: 62.7 kg.                                                       POSTOPERATIVE DIAGNOSIS:   R Knee OA                                                                     PROCEDURE:  Procedure(s): RIGHT TOTAL KNEE ARTHROPLASTY Using DepuyAttune RP implants #4R Femur, #5Tibia, 6 mm Attune RP bearing, 35 Patella    SURGEON:  ASSISTANT:   Nestor Lewandowsky. Tomi Likens   (Present and scrubbed throughout the case, critical for assistance with exposure, retraction, instrumentation, and closure.)        ANESTHESIA: Spinal, 20cc Exparel, 50cc 0.25% Marcaine EBL: 400 cc FLUID REPLACEMENT: 1500 cc crystaloid TOURNIQUET: DRAINS: None TRANEXAMIC ACID: 1gm IV, 2gm topical COMPLICATIONS:  None         INDICATIONS FOR PROCEDURE: The patient has  RIGHT KNEE OSTEOARTHRITIS, Var deformities, XR shows bone on bone arthritis, lateral subluxation of tibia. Patient has failed all conservative measures including anti-inflammatory medicines, narcotics, attempts at exercise and weight loss, cortisone injections and viscosupplementation.  Risks and benefits of surgery have been discussed, questions answered.   DESCRIPTION OF PROCEDURE: The patient identified by armband, received  IV antibiotics, in the holding area at [facility]. Patient taken to the operating room, appropriate anesthetic monitors were attached, and spinal anesthesia was  induced. IV Tranexamic acid was given.Tourniquet applied high to the operative thigh. Lateral post and foot positioner applied to the table, the lower extremity was then prepped and draped in usual sterile fashion  from the toes to the tourniquet. Time-out procedure was performed. [facility]. [facility] PAC, was present and scrubbed throughout the case, critical for assistance with, positioning, exposure, retraction, instrumentation, and closure.The skin and subcutaneous tissue along the incision was injected with 20 cc of a mixture of Exparel and Marcaine solution, using a 20-gauge by 1-1/2 inch needle. We began the operation, with the knee flexed 130 degrees, by making the anterior midline incision starting at handbreadth above the patella going over the patella 1 cm medial to and 4 cm distal to the tibial tubercle. Small bleeders in the skin and the subcutaneous tissue identified and cauterized. Transverse retinaculum was incised and reflected medially and a medial parapatellar arthrotomy was accomplished. the patella was everted and theprepatellar fat pad resected. The superficial medial collateral ligament was then elevated from anterior to posterior along the proximal flare of the tibia and anterior half of the menisci resected. The knee was hyperflexed exposing bone  on bone arthritis. Peripheral and notch osteophytes as well as the cruciate ligaments were then resected. We continued to work our way around posteriorly along the proximal tibia, and externally rotated the tibia subluxing it out from underneath the femur. A McHale PCL retractor was placed through the notch and a lateral Hohmann retractor placed, and we then entered the proximal tibia in line with the Depuy starter drill in line with the axis of the tibia followed by an intramedullary guide rod and 0-degree posterior slope cutting guide. The tibial cutting guide, 4 degree posterior sloped, was pinned into place allowing resection of 3 mm of bone medially and 8 mm of bone laterally. Satisfied with the tibial resection, we then entered the distal femur 2 mm anterior to the PCL origin with the intramedullary guide rod and applied the distal femoral cutting guide set  at 9 mm, with 5 degrees of valgus. This was pinned along the epicondylar axis. At this point, the distal femoral cut was accomplished without difficulty. We then sized for a #4R femoral component and pinned the guide in 3 degrees of external rotation. The chamfer cutting guide was pinned into place. The anterior, posterior, and chamfer cuts were accomplished without difficulty followed by the Attune RP box cutting guide and the box cut. We also removed posterior osteophytes from the posterior femoral condyles. The posterior capsule was injected with Exparel solution. The knee was brought into full extension. We checked our extension gap and fit a 6 mm bearing. Distracting in extension with a lamina spreader,  bleeders in the posterior capsule, Posterior medial and posterior lateral gutter were cauterized.  The transexamic acid-soaked sponge was then placed in the gap of the knee in extension. The knee was flexed 30. The posterior patella cut was accomplished with the 9.5 mm Attune cutting guide, sized for a 36mm dome, and the fixation pegs drilled.The knee was then once again hyperflexed exposing the proximal tibia. We sized for a # 5 tibial base plate, applied the smokestack and the conical reamer followed by the the Delta fin keel punch. We then hammered into place the Attune RP trial femoral component, drilled the lugs, inserted a  6 mm trial bearing, trial patellar button, and took the knee through range of motion from 0-130 degrees. Medial and lateral ligamentous stability was checked. No thumb pressure was required for patellar Tracking. The tourniquet was not used. All trial components were removed, mating surfaces irrigated with pulse lavage, and dried with suction and sponges. 10 cc of the Exparel solution was applied to the cancellus bone of the patella distal femur and proximal tibia.  After waiting 30 seconds, the bony surfaces were again, dried with sponges. A double batch of DePuy HV cement was mixed  and applied to all bony metallic mating surfaces except for the posterior condyles of the femur itself. In order, we hammered into place the tibial tray and removed excess cement, the femoral component and removed excess cement. The final Attune RP bearing was inserted, and the knee brought to full extension with compression. The patellar button was clamped into place, and excess cement removed. The knee was held at 30 flexion with compression, while the cement cured. The wound was irrigated out with normal saline solution pulse lavage. The rest of the Exparel was injected into the parapatellar arthrotomy, subcutaneous tissues, and periosteal tissues. The parapatellar arthrotomy was closed with running #1 Vicryl suture. The subcutaneous tissue with 0 and 2-0 undyed Vicryl suture, and the skin with running  3-0 SQ vicryl. An Aquacil and Ace wrap were applied. The patient was taken to recovery room without difficulty.   Nestor Lewandowsky 01/28/2020, 6:59 AM

## 2020-01-28 NOTE — Evaluation (Signed)
Physical Therapy Evaluation Patient Details Name: Danielle Andersen MRN: [id number] DOB: [birth year] Today's Date: 01/28/2020   History of Present Illness  s/p R TKA. PMH: partial hip arthroplasty, meniscus repair, appy  Clinical Impression  Pt is s/p TKA resulting in the deficits listed below (see PT Problem List).  Pt doing very well today, amb ~ 78' with RW and min/guard assist. Anticipate steady progress and likely d/c tomorrow.   Pt will benefit from skilled PT to increase their independence and safety with mobility to allow discharge to the venue listed below.      Follow Up Recommendations Follow surgeon's recommendation for DC plan and follow-up therapies    Equipment Recommendations  Rolling walker with 5" wheels(youth height RW)    Recommendations for Other Services       Precautions / Restrictions Precautions Precautions: Fall;Knee Restrictions Weight Bearing Restrictions: No Other Position/Activity Restrictions: WBAT      Mobility  Bed Mobility Overal bed mobility: Needs Assistance Bed Mobility: Supine to Sit     Supine to sit: Min guard     General bed mobility comments: for safety  Transfers Overall transfer level: Needs assistance Equipment used: Rolling walker (2 wheeled) Transfers: Sit to/from Stand Sit to Stand: Min assist         General transfer comment: cues for hand placement  Ambulation/Gait Ambulation/Gait assistance: Min assist Gait Distance (Feet): 60 Feet Assistive device: Rolling walker (2 wheeled) Gait Pattern/deviations: Step-to pattern     General Gait Details: cues for sequence  Stairs            Wheelchair Mobility    Modified Rankin (Stroke Patients Only)       Balance                                             Pertinent Vitals/Pain Pain Assessment: 0-10 Pain Score: 5  Pain Location: right knee Pain Descriptors / Indicators: Grimacing;Sore;Aching Pain Intervention(s): Limited activity  within patient's tolerance;Monitored during session;Repositioned    Home Living Family/patient expects to be discharged to:: Private residence Living Arrangements: Spouse/significant other   Type of Home: House Home Access: Ramped entrance     Home Layout: One level Home Equipment: Wheelchair - manual;Crutches;Shower seat      Prior Function Level of Independence: Independent               Hand Dominance        Extremity/Trunk Assessment   Upper Extremity Assessment Upper Extremity Assessment: Overall WFL for tasks assessed    Lower Extremity Assessment Lower Extremity Assessment: RLE deficits/detail RLE Deficits / Details: ankle WFL, knee extension and hip flexion 3/5, AAROM grossly 5 to 70 degrees       Communication   Communication: No difficulties  Cognition Arousal/Alertness: Awake/alert Behavior During Therapy: WFL for tasks assessed/performed Overall Cognitive Status: Within Functional Limits for tasks assessed                                        General Comments      Exercises Total Joint Exercises Ankle Circles/Pumps: AROM;Both;10 reps Quad Sets: AROM;Both;5 reps   Assessment/Plan    PT Assessment Patient needs continued PT services  PT Problem List Decreased strength;Decreased range of motion;Decreased activity tolerance;Decreased mobility;Decreased knowledge of use of DME;Pain  PT Treatment Interventions DME instruction;Therapeutic exercise;Gait training;Functional mobility training;Therapeutic activities;Patient/family education    PT Goals (Current goals can be found in the Care Plan section)  Acute Rehab PT Goals Patient Stated Goal: less knee pain PT Goal Formulation: With patient Time For Goal Achievement: 02/04/20 Potential to Achieve Goals: Good    Frequency 7X/week   Barriers to discharge        Co-evaluation               AM-PAC PT "6 Clicks" Mobility  Outcome Measure Help needed turning  from your back to your side while in a flat bed without using bedrails?: A Little Help needed moving from lying on your back to sitting on the side of a flat bed without using bedrails?: A Little Help needed moving to and from a bed to a chair (including a wheelchair)?: A Little Help needed standing up from a chair using your arms (e.g., wheelchair or bedside chair)?: A Little Help needed to walk in hospital room?: A Little Help needed climbing 3-5 steps with a railing? : A Little 6 Click Score: 18    End of Session Equipment Utilized During Treatment: Gait belt Activity Tolerance: Patient tolerated treatment well Patient left: in chair;with call bell/phone within reach;with chair alarm set   PT Visit Diagnosis: Difficulty in walking, not elsewhere classified (R26.2)    Time: 7001-7494 PT Time Calculation (min) (ACUTE ONLY): 26 min   Charges:   PT Evaluation $PT Eval Low Complexity: 1 Low PT Treatments $Gait Training: 8-22 mins        Delice Bison, PT   Acute Rehab Dept [facility]): [phone]   01/28/2020   [facility] 01/28/2020, 2:36 PM

## 2020-01-29 ENCOUNTER — Encounter: Payer: Self-pay | Admitting: *Deleted

## 2020-01-29 DIAGNOSIS — M1711 Unilateral primary osteoarthritis, right knee: Secondary | ICD-10-CM | POA: Diagnosis not present

## 2020-01-29 LAB — BASIC METABOLIC PANEL
Anion gap: 6 (ref 5–15)
BUN: 18 mg/dL (ref 8–23)
CO2: 26 mmol/L (ref 22–32)
Calcium: 8.8 mg/dL — ABNORMAL LOW (ref 8.9–10.3)
Chloride: 108 mmol/L (ref 98–111)
Creatinine, Ser: 0.72 mg/dL (ref 0.44–1.00)
GFR calc Af Amer: >60 mL/min (ref >60)
GFR calc non Af Amer: >60 mL/min (ref >60)
Glucose, Bld: 155 mg/dL — ABNORMAL HIGH (ref 70–99)
Potassium: 4.6 mmol/L (ref 3.5–5.1)
Sodium: 140 mmol/L (ref 135–145)

## 2020-01-29 LAB — CBC
HCT: 31 % — ABNORMAL LOW (ref 36.0–46.0)
Hemoglobin: 9.5 g/dL — ABNORMAL LOW (ref 12.0–15.0)
MCH: 30.7 pg (ref 26.0–34.0)
MCHC: 30.6 g/dL (ref 30.0–36.0)
MCV: 100.3 fL — ABNORMAL HIGH (ref 80.0–100.0)
Platelets: 243 10*3/uL (ref 150–400)
RBC: 3.09 MIL/uL — ABNORMAL LOW (ref 3.87–5.11)
RDW: 13.1 % (ref 11.5–15.5)
WBC: 10.8 10*3/uL — ABNORMAL HIGH (ref 4.0–10.5)
nRBC: 0 % (ref 0.0–0.2)

## 2020-01-29 NOTE — Progress Notes (Signed)
Physical Therapy Treatment Patient Details Name: Danielle Andersen MRN: [id number] DOB: [birth year] Today's Date: 01/29/2020    History of Present Illness s/p R TKA. PMH: partial hip arthroplasty, meniscus repair, appy    PT Comments    Pt is progressing very well. Pain elevated a bit after amb, ice to knee and will return for second  session  To review HEP.   Follow Up Recommendations  Follow surgeon's recommendation for DC plan and follow-up therapies     Equipment Recommendations  Rolling walker with 5" wheels    Recommendations for Other Services       Precautions / Restrictions Precautions Precautions: Fall;Knee Restrictions Weight Bearing Restrictions: No Other Position/Activity Restrictions: WBAT    Mobility  Bed Mobility Overal bed mobility: Needs Assistance Bed Mobility: Supine to Sit     Supine to sit: Supervision     General bed mobility comments: for safety  Transfers Overall transfer level: Needs assistance Equipment used: Rolling walker (2 wheeled) Transfers: Sit to/from Stand Sit to Stand: Min guard         General transfer comment: cues for hand placement  Ambulation/Gait Ambulation/Gait assistance: Supervision;Min guard Gait Distance (Feet): 130 Feet(10' more ) Assistive device: Rolling walker (2 wheeled) Gait Pattern/deviations: Step-to pattern;Decreased stance time - right     General Gait Details: cues for sequence   Stairs             Wheelchair Mobility    Modified Rankin (Stroke Patients Only)       Balance                                            Cognition Arousal/Alertness: Awake/alert Behavior During Therapy: WFL for tasks assessed/performed Overall Cognitive Status: Within Functional Limits for tasks assessed                                        Exercises      General Comments        Pertinent Vitals/Pain Pain Assessment: 0-10 Pain Score: 3  Pain Location:  right knee Pain Descriptors / Indicators: Grimacing;Sore;Aching Pain Intervention(s): Limited activity within patient's tolerance;Monitored during session;Premedicated before session;Repositioned;Ice applied    Home Living                      Prior Function            PT Goals (current goals can now be found in the care plan section) Acute Rehab PT Goals Patient Stated Goal: less knee pain PT Goal Formulation: With patient Time For Goal Achievement: 02/04/20 Potential to Achieve Goals: Good Progress towards PT goals: Progressing toward goals    Frequency    7X/week      PT Plan Current plan remains appropriate    Co-evaluation              AM-PAC PT "6 Clicks" Mobility   Outcome Measure  Help needed turning from your back to your side while in a flat bed without using bedrails?: A Little Help needed moving from lying on your back to sitting on the side of a flat bed without using bedrails?: A Little Help needed moving to and from a bed to a chair (including a wheelchair)?: A Little Help needed standing up from  a chair using your arms (e.g., wheelchair or bedside chair)?: A Little Help needed to walk in hospital room?: A Little Help needed climbing 3-5 steps with a railing? : A Little 6 Click Score: 18    End of Session Equipment Utilized During Treatment: Gait belt Activity Tolerance: Patient tolerated treatment well Patient left: in chair;with call bell/phone within reach;with chair alarm set   PT Visit Diagnosis: Difficulty in walking, not elsewhere classified (R26.2)     Time: 6967-8938 PT Time Calculation (min) (ACUTE ONLY): 27 min  Charges:  $Gait Training: 23-37 mins                     Izek Corvino, PT   Acute Rehab Dept [facility]): [phone]   01/29/2020    [facility] 01/29/2020, 10:57 AM

## 2020-01-29 NOTE — Discharge Summary (Signed)
Patient ID: Danielle Andersen MRN: [id number] DOB/AGE: [birth year] 64 y.o.  Admit date: 01/28/2020 Discharge date: 01/29/2020  Admission Diagnoses:  Principal Problem:   Osteoarthritis of right knee Active Problems:   Total knee replacement status, right   Discharge Diagnoses:  Same  Past Medical History:  Diagnosis Date  . Arthritis   . Bronchitis     Surgeries: Procedure(s): RIGHT TOTAL KNEE ARTHROPLASTY on 01/28/2020   Consultants:   Discharged Condition: Improved  Hospital Course: Danielle Andersen is an 64 y.o. female who was admitted 01/28/2020 for operative treatment ofOsteoarthritis of right knee. Patient has severe unremitting pain that affects sleep, daily activities, and work/hobbies. After pre-op clearance the patient was taken to the operating room on 01/28/2020 and underwent  Procedure(s): RIGHT TOTAL KNEE ARTHROPLASTY.    Patient was given perioperative antibiotics:  Anti-infectives (From admission, onward)   Start     Dose/Rate Route Frequency Ordered Stop   01/28/20 0600  ceFAZolin (ANCEF) IVPB 2g/100 mL premix     2 g 200 mL/hr over 30 Minutes Intravenous On call to O.R. 01/28/20 0534 01/28/20 0755       Patient was given sequential compression devices, early ambulation, and chemoprophylaxis to prevent DVT.  Patient benefited maximally from hospital stay and there were no complications.    Recent vital signs:  Patient Vitals for the past 24 hrs:  BP Temp Temp src Pulse Resp SpO2  01/29/20 0514 (Abnormal) 109/56 (Abnormal) 97.3 F (36.3 C) Oral 84 17 100 %  01/29/20 0113 120/60 97.7 F (36.5 C) Oral 75 17 99 %  01/28/20 2109 (Abnormal) 114/58 98.4 F (36.9 C) Oral 76 18 100 %  01/28/20 1401 (Abnormal) 117/55 97.9 F (36.6 C) Oral 85 16 100 %  01/28/20 1258 (Abnormal) 120/54 (Abnormal) 97.5 F (36.4 C) Oral 90 16 100 %  01/28/20 1200 134/64 98 F (36.7 C) Oral 82 16 100 %  01/28/20 1055 (Abnormal) 122/53 (Abnormal) 97.5 F (36.4 C) Oral 78 no  documentation 99 %  01/28/20 1030 no documentation (Abnormal) 97.4 F (36.3 C) no documentation no documentation no documentation no documentation  01/28/20 0915 112/63 97.6 F (36.4 C) no documentation 88 16 98 %     Recent laboratory studies:  Recent Labs    01/29/20 0316  WBC 10.8*  HGB 9.5*  HCT 31.0*  PLT 243  NA 140  K 4.6  CL 108  CO2 26  BUN 18  CREATININE 0.72  GLUCOSE 155*  CALCIUM 8.8*     Discharge Medications:   Allergies as of 01/29/2020   No Known Allergies     Medication List    Stop taking these medications   meloxicam 15 MG tablet Commonly known as: MOBIC   traMADol 50 MG tablet Commonly known as: ULTRAM   Tylenol PM Extra Strength 25-500 MG Tabs tablet Generic drug: diphenhydramine-acetaminophen     Take these medications   albuterol 108 (90 Base) MCG/ACT inhaler Commonly known as: VENTOLIN HFA Inhale 1-2 puffs into the lungs every 6 (six) hours as needed for wheezing or shortness of breath.   aspirin EC 81 MG tablet Take 1 tablet (81 mg total) by mouth 2 (two) times daily.   cetirizine 10 MG tablet Commonly known as: ZYRTEC Take 10 mg by mouth daily.   DIPHENHYDRAMINE-ACETAMINOPHEN PO Take 1 tablet by mouth daily. Legatrin PM Pain Reliever/Sleep Aid   gabapentin 100 MG capsule Commonly known as: NEURONTIN Take 100 mg by mouth 3 (three) times daily.   oxyCODONE-acetaminophen 5-325 MG  tablet Commonly known as: PERCOCET/ROXICET Take 1 tablet by mouth every 4 (four) hours as needed for severe pain.   Stool Softener 100 MG capsule Generic drug: docusate sodium Take 100-200 mg by mouth every other day as needed (constipation.).   tetrahydrozoline 0.05 % ophthalmic solution Place 2 drops into both eyes daily.   tiZANidine 2 MG tablet Commonly known as: ZANAFLEX Take 1 tablet (2 mg total) by mouth every 6 (six) hours as needed.        Durable Medical Equipment  (From admission, onward)         Start     Ordered    01/28/20 1622  For home use only DME Walker rolling  Once    Comments: [employer] RW  Question Answer Comment  Walker: With 5 Inch Wheels   Patient needs a walker to treat with the following condition Knee joint replacement status, right      01/28/20 1622   01/28/20 1100  DME Walker rolling  Once    Question:  Patient needs a walker to treat with the following condition  Answer:  Status post right knee replacement   01/28/20 1059   01/28/20 1100  DME 3 n 1  Once     01/28/20 1059           Discharge Care Instructions  (From admission, onward)         Start     Ordered   01/29/20 0000  Change dressing    Comments: Change dressing Only if drainage exceeds 40% of window on dressing   01/29/20 0721          Diagnostic Studies: DG Chest 2 View  Result Date: 01/18/2020 CLINICAL DATA:  Pre operative respiratory exam. EXAM: CHEST - 2 VIEW COMPARISON:  None. FINDINGS: The heart size and pulmonary vascularity are normal and the lungs are clear. No effusions. No acute bone abnormality. Diffuse degenerative disc disease throughout the thoracic spine. 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 IMPRESSION: No acute disease. Electronically Signed   By: Francene Boyers M.D.   On: 01/18/2020 13:40    Disposition: Discharge disposition: 01-Home or Self Care       Discharge Instructions    Call MD / Call 911   Complete by: As directed    If you experience chest pain or shortness of breath, CALL 911 and be transported to the hospital emergency room.  If you develope a fever above 101 F, pus (white drainage) or increased drainage or redness at the wound, or calf pain, call your surgeon's office.   Change dressing   Complete by: As directed    Change dressing Only if drainage exceeds 40% of window on dressing   Constipation Prevention   Complete by: As directed    Drink plenty of fluids.  Prune juice may be helpful.   You may use a stool softener, such as Colace (over the counter) 100 mg twice a day.  Use MiraLax (over the counter) for constipation as needed.   Diet - low sodium heart healthy   Complete by: As directed    Increase activity slowly as tolerated   Complete by: As directed       Follow-up Information    Gean Birchwood, MD In 2 weeks.   Specialty: Orthopedic Surgery Contact information: [address] [place] [address] [phone]  Signed: Nestor Lewandowsky 01/29/2020, 7:21 AM

## 2020-01-29 NOTE — TOC Transition Note (Signed)
Transition of Care [facility]) - CM/SW Discharge Note   Patient Details  Name: Danielle Andersen MRN: [id number] Date of Birth: Feb 18, 1956  [facility] CM/SW Contact:  Amada Jupiter, LCSW Phone Number: 01/29/2020, 2:34 PM   Clinical Narrative:   Pt for d/c today with [facility] and DME referred to local agencies via [facility] coordinator/ [facility].  No further needs.    Final next level of care: Home w Home Health Services Barriers to Discharge: Barriers Resolved   Patient Goals and CMS Choice Patient states their goals for this hospitalization and ongoing recovery are:: to go home CMS Medicare.gov Compare Post Acute Care list provided to:: Patient    Discharge Placement                       Discharge Plan and Services   Discharge Planning Services: CM Consult Post Acute Care Choice: Durable Medical Equipment, Home Health          DME Arranged: Walker rolling DME Agency: Other - Comment([facility] - arranged vie [facility]) Date DME Agency Contacted: 01/29/20 Time DME Agency Contacted: 1100   HH Arranged: PT HH Agency: [facility](arranged via [facility])) Date HH Agency Contacted: 01/29/20      Social Determinants of Health (SDOH) Interventions     Readmission Risk Interventions No flowsheet data found.

## 2020-01-29 NOTE — Progress Notes (Signed)
   01/29/20 1238  PT Visit Information  Last PT Received On 01/29/20  Pt ready to d/c from PT standpoint. Progressing very well, motivated to make a full recovery from this knee surgery.  Assistance Needed +1  History of Present Illness s/p R TKA. PMH: partial hip arthroplasty, meniscus repair, appy  Subjective Data  Patient Stated Goal less knee pain  Precautions  Precautions Fall;Knee  Restrictions  Weight Bearing Restrictions No  Other Position/Activity Restrictions WBAT  Pain Assessment  Pain Assessment 0-10  Pain Score 3  Pain Location right knee  Pain Descriptors / Indicators Grimacing;Sore;Aching  Pain Intervention(s) Limited activity within patient's tolerance;Monitored during session  Cognition  Arousal/Alertness Awake/alert  Behavior During Therapy WFL for tasks assessed/performed  Overall Cognitive Status Within Functional Limits for tasks assessed  Total Joint Exercises  Ankle Circles/Pumps AROM;Both;10 reps  Quad Sets AROM;Both;10 reps  Heel Slides AAROM;AROM;Right;10 reps  Hip ABduction/ADduction AROM;AAROM;Right;10 reps  Straight Leg Raises AROM;AAROM;Right;10 reps  Short Arc [employer];Right;10 reps  Goniometric ROM grossly  4 to 70 degrees right  knee flexion  PT - End of Session  Equipment Utilized During Treatment Gait belt  Activity Tolerance Patient tolerated treatment well  Patient left in chair;with call bell/phone within reach;with chair alarm set   PT - Assessment/Plan  PT Plan Current plan remains appropriate  PT Visit Diagnosis Difficulty in walking, not elsewhere classified (R26.2)  PT Frequency (ACUTE ONLY) 7X/week  Follow Up Recommendations Follow surgeon's recommendation for DC plan and follow-up therapies  PT equipment Rolling walker with 5" wheels  AM-PAC PT "6 Clicks" Mobility Outcome Measure (Version 2)  Help needed turning from your back to your side while in a flat bed without using bedrails? 3  Help needed moving from lying on your  back to sitting on the side of a flat bed without using bedrails? 3  Help needed moving to and from a bed to a chair (including a wheelchair)? 3  Help needed standing up from a chair using your arms (e.g., wheelchair or bedside chair)? 3  Help needed to walk in hospital room? 3  Help needed climbing 3-5 steps with a railing?  3  6 Click Score 18  Consider Recommendation of Discharge To: Home with [facility]  PT Goal Progression  Progress towards PT goals Progressing toward goals  Acute Rehab PT Goals  PT Goal Formulation With patient  Time For Goal Achievement 02/04/20  Potential to Achieve Goals Good  PT Time Calculation  PT Start Time (ACUTE ONLY) 1218  PT Stop Time (ACUTE ONLY) 1236  PT Time Calculation (min) (ACUTE ONLY) 18 min  PT General Charges  $$ ACUTE PT VISIT 1 Visit  PT Treatments  $Therapeutic Exercise 8-22 mins

## 2020-01-29 NOTE — Progress Notes (Signed)
Patient ID: Danielle Andersen, female   DOB: [birth year], 64 y.o.   MRN: [id number] PATIENT ID: Danielle Andersen  MRN: [id number]  DOB/AGE:  [birth year] / 64 y.o.  1 Day Post-Op Procedure(s) (LRB): RIGHT TOTAL KNEE ARTHROPLASTY (Right)    PROGRESS NOTE Subjective: Patient is alert, oriented, no Nausea, no Vomiting, yes passing gas. Taking PO well. Denies SOB, Chest or Calf Pain. Using Incentive Spirometer, PAS in place. Ambulate 60', Patient reports pain as 2/10 .    Objective: Vital signs in last 24 hours: Vitals:   01/28/20 1401 01/28/20 2109 01/29/20 0113 01/29/20 0514  BP: (Abnormal) 117/55 (Abnormal) 114/58 120/60 (Abnormal) 109/56  Pulse: 85 76 75 84  Resp: 16 18 17 17   Temp: 97.9 F (36.6 C) 98.4 F (36.9 C) 97.7 F (36.5 C) (Abnormal) 97.3 F (36.3 C)  TempSrc: Oral Oral Oral Oral  SpO2: 100% 100% 99% 100%  Weight:      Height:          Intake/Output from previous day: I/O last 3 completed shifts: In: 4927.8 [P.O.:600; I.V.:3627.8; Other:500; IV Piggyback:200] Out: 2925 [Urine:2825; Blood:100]   Intake/Output this shift: No intake/output data recorded.   LABORATORY DATA: Recent Labs    01/29/20 0316  WBC 10.8*  HGB 9.5*  HCT 31.0*  PLT 243  NA 140  K 4.6  CL 108  CO2 26  BUN 18  CREATININE 0.72  GLUCOSE 155*  CALCIUM 8.8*    Examination: Neurologically intact ABD soft Neurovascular intact Sensation intact distally Intact pulses distally Dorsiflexion/Plantar flexion intact Incision: dressing C/D/I No cellulitis present Compartment soft}  Assessment:   1 Day Post-Op Procedure(s) (LRB): RIGHT TOTAL KNEE ARTHROPLASTY (Right) ADDITIONAL DIAGNOSIS: Expected Acute Blood Loss Anemia,   Patient's anticipated LOS is less than 2 midnights, meeting these requirements: - Younger than 73 - Lives within 1 hour of care - Has a competent adult at home to recover with post-op recover - NO history of  - Chronic pain requiring opiods  - Diabetes  - Coronary  Artery Disease  - Heart failure  - Heart attack  - Stroke  - DVT/VTE  - Cardiac arrhythmia  - Respiratory Failure/COPD  - Renal failure  - Anemia  - Advanced Liver disease       Plan: PT/OT WBAT, AROM and PROM  DVT Prophylaxis:  SCDx72hrs, ASA 81 mg BID x 2 weeks DISCHARGE PLAN: Home, after passes PT DISCHARGE NEEDS: HHPT, Walker and 3-in-1 comode seat     76 01/29/2020, 7:18 AM

## 2020-10-18 IMAGING — CR DG CHEST 2V
2 series · 2 of 2 positions shown · non-contrast
Comparison: None.

CLINICAL DATA: Pre operative respiratory exam.

EXAM:
CHEST - 2 VIEW

[w chest pa]
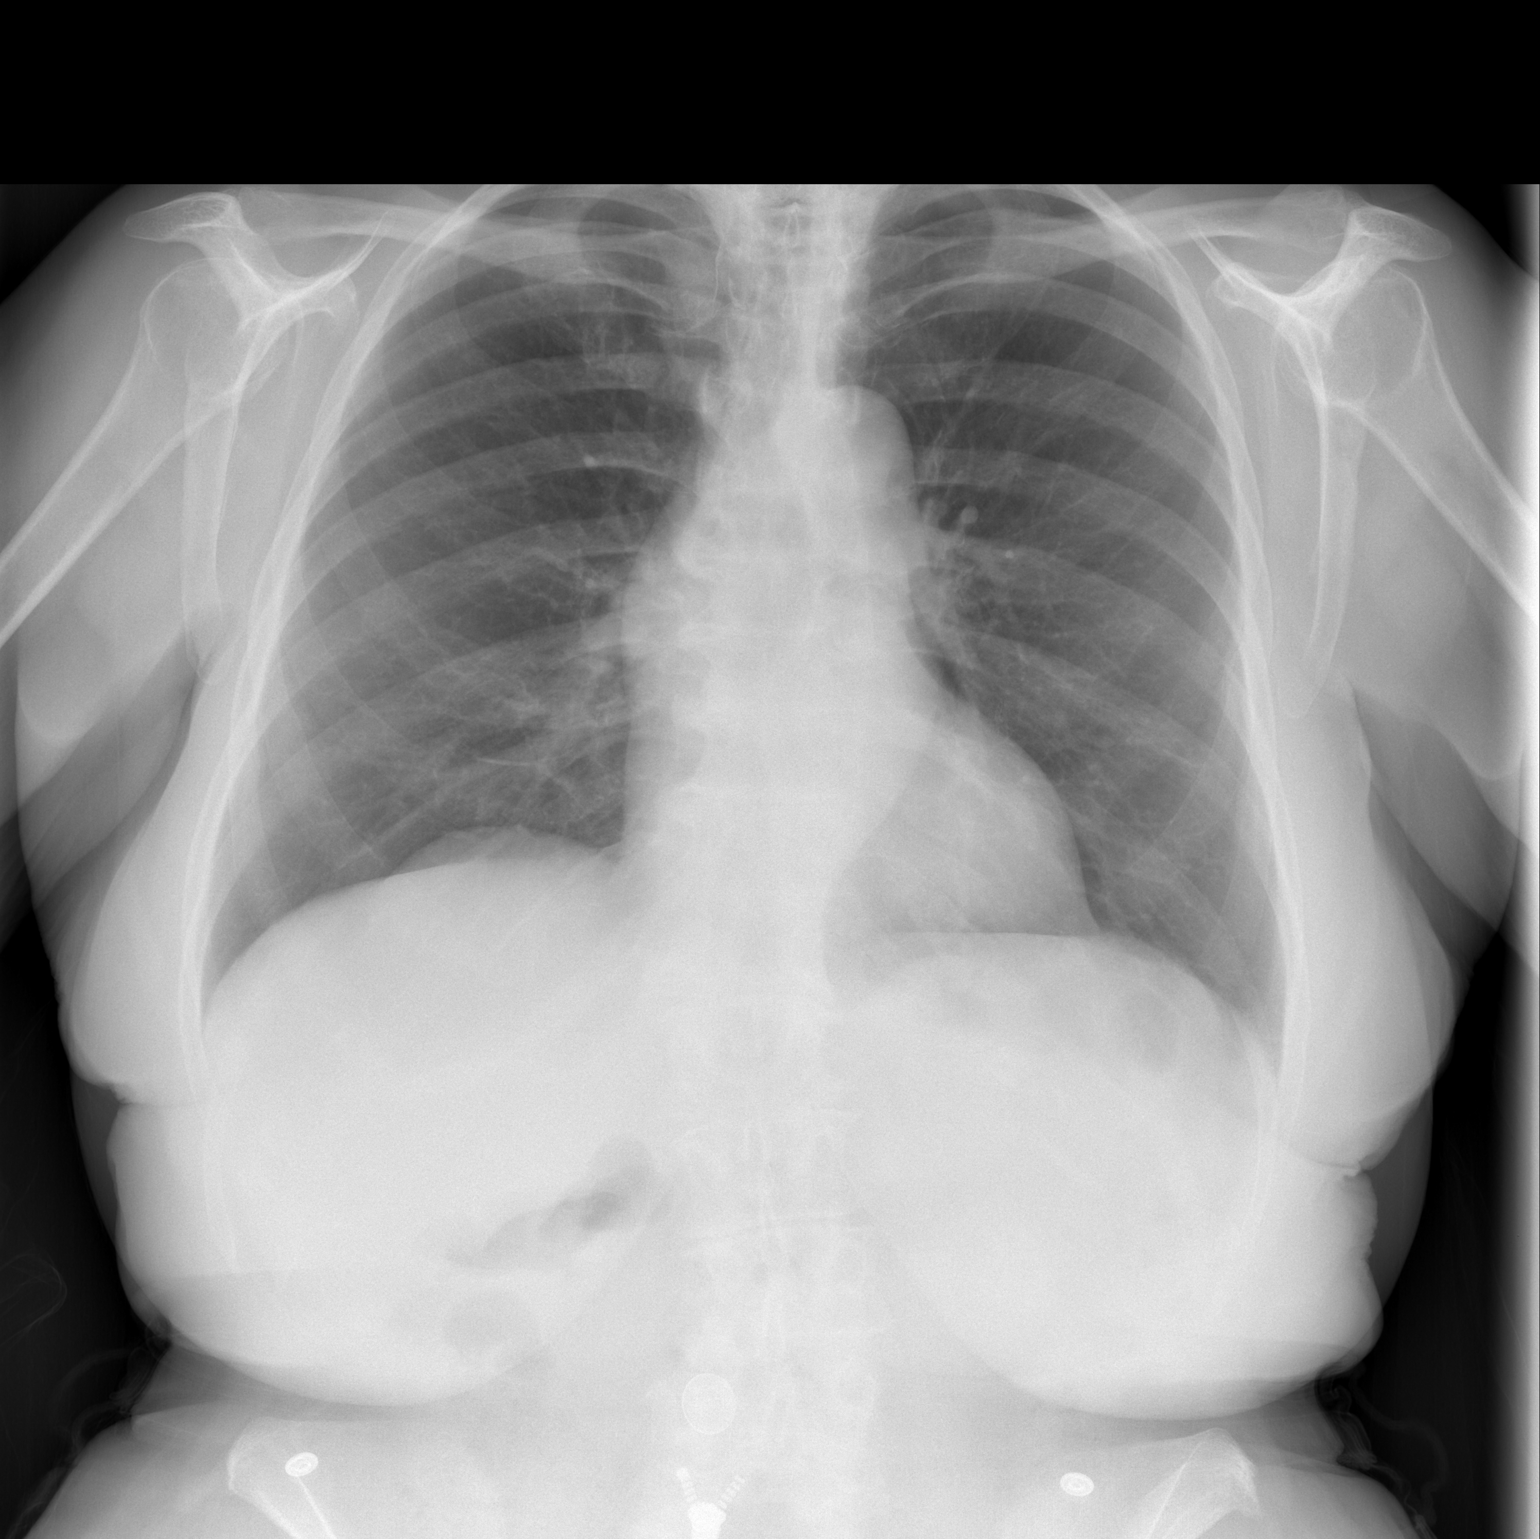

[w chest lat]
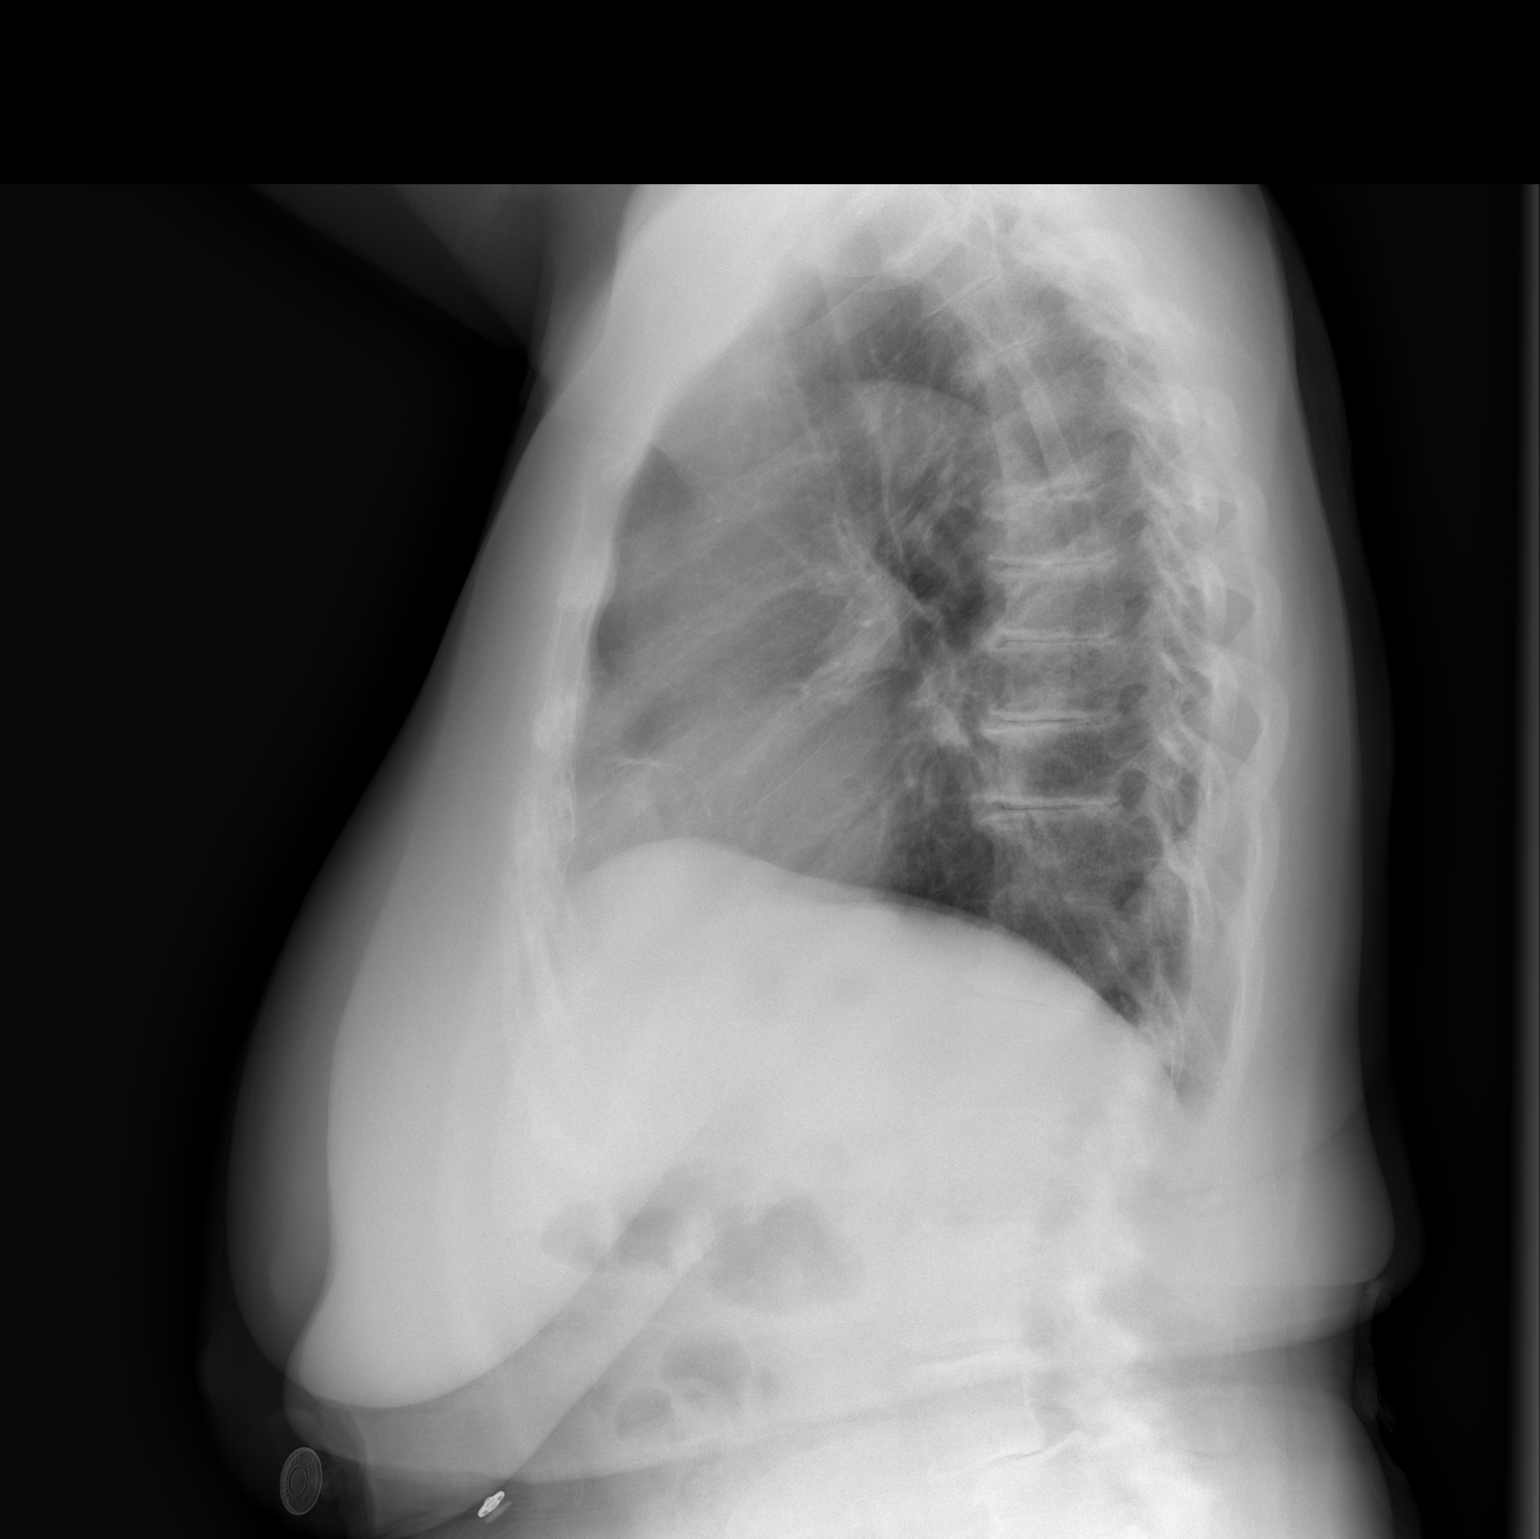

[2 of 2 positions shown; findings below may reference images not displayed]

FINDINGS: The heart size and pulmonary vascularity are normal and the lungs
are clear. No effusions. No acute bone abnormality.

Diffuse degenerative disc disease throughout the thoracic spine.
There appears to be fusion 2 of the upper thoracic vertebra and of 2
of the lower thoracic vertebra, likely congenital.
IMPRESSION: No acute disease.
